# Patient Record
Sex: Female | Born: 1950 | Race: White | Hispanic: No | Marital: Married | Smoking: Former smoker
Health system: Southern US, Community
[De-identification: ages and names within clinical notes are randomized; demographics above are authoritative.]

## PROBLEM LIST (undated history)

## (undated) HISTORY — PX: BACK SURGERY: SHX140

## (undated) HISTORY — PX: ABDOMINAL HYSTERECTOMY: SHX81

## (undated) HISTORY — PX: REPLACEMENT TOTAL KNEE: SUR1224

## (undated) HISTORY — PX: CHOLECYSTECTOMY: SHX55

## (undated) HISTORY — PX: OTHER SURGICAL HISTORY: SHX169

---

## 1997-10-30 ENCOUNTER — Ambulatory Visit (HOSPITAL_COMMUNITY): Admission: RE | Admit: 1997-10-30 | Discharge: 1997-10-30 | Payer: Self-pay | Admitting: Neurological Surgery

## 2002-06-22 ENCOUNTER — Emergency Department (HOSPITAL_COMMUNITY): Admission: EM | Admit: 2002-06-22 | Discharge: 2002-06-22 | Payer: Self-pay | Admitting: Emergency Medicine

## 2004-09-10 ENCOUNTER — Emergency Department (HOSPITAL_COMMUNITY): Admission: EM | Admit: 2004-09-10 | Discharge: 2004-09-10 | Payer: Self-pay | Admitting: Emergency Medicine

## 2008-06-24 ENCOUNTER — Emergency Department (HOSPITAL_COMMUNITY): Admission: EM | Admit: 2008-06-24 | Discharge: 2008-06-24 | Payer: Self-pay | Admitting: Emergency Medicine

## 2010-02-03 ENCOUNTER — Emergency Department (HOSPITAL_COMMUNITY): Admission: EM | Admit: 2010-02-03 | Discharge: 2010-02-03 | Payer: Self-pay | Admitting: Emergency Medicine

## 2013-05-13 ENCOUNTER — Emergency Department (HOSPITAL_COMMUNITY): Admission: EM | Admit: 2013-05-13 | Discharge: 2013-05-13 | Discharge status: Against Medical Advice | Payer: Self-pay

## 2017-06-19 ENCOUNTER — Other Ambulatory Visit: Payer: Self-pay

## 2017-06-19 ENCOUNTER — Emergency Department (HOSPITAL_COMMUNITY): Payer: Medicare Other

## 2017-06-19 ENCOUNTER — Emergency Department (HOSPITAL_COMMUNITY)
Admission: EM | Admit: 2017-06-19 | Discharge: 2017-06-19 | Disposition: A | Discharge status: Home / Self Care | Payer: Medicare Other | Attending: Emergency Medicine | Admitting: Emergency Medicine

## 2017-06-19 ENCOUNTER — Encounter (HOSPITAL_COMMUNITY): Payer: Self-pay

## 2017-06-19 DIAGNOSIS — M25522 Pain in left elbow: Secondary | ICD-10-CM | POA: Diagnosis not present

## 2017-06-19 DIAGNOSIS — M25551 Pain in right hip: Secondary | ICD-10-CM | POA: Insufficient documentation

## 2017-06-19 DIAGNOSIS — Z87891 Personal history of nicotine dependence: Secondary | ICD-10-CM | POA: Insufficient documentation

## 2017-06-19 MED ORDER — KETOROLAC TROMETHAMINE 60 MG/2ML IM SOLN
30.0000 mg | Freq: Once | INTRAMUSCULAR | Status: AC
  Administered 2017-06-19: 30 mg via INTRAMUSCULAR
  Filled 2017-06-19: qty 2

## 2017-06-19 MED ORDER — METHOCARBAMOL 500 MG PO TABS: 500.0000 mg | ORAL_TABLET | Freq: Three times a day (TID) | ORAL | 0 refills | Status: AC | PRN

## 2017-06-19 MED ORDER — IBUPROFEN 400 MG PO TABS: 400.0000 mg | ORAL_TABLET | Freq: Four times a day (QID) | ORAL | 0 refills | Status: AC | PRN
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
